# Patient Record
Sex: Female | Born: 1995 | Hispanic: No | Marital: Single | Smoking: Never smoker
Health system: Southern US, Community
[De-identification: ages and names within clinical notes are randomized; demographics above are authoritative.]

## PROBLEM LIST (undated history)

## (undated) DIAGNOSIS — J45909 Unspecified asthma, uncomplicated: Secondary | ICD-10-CM

## (undated) HISTORY — PX: TONSILLECTOMY: SUR1361

## (undated) HISTORY — PX: HAND LIGAMENT RECONSTRUCTION: SHX1726

## (undated) HISTORY — PX: BUNIONECTOMY: SHX129

---
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
## 2017-10-19 ENCOUNTER — Encounter: Payer: Self-pay | Admitting: Allergy and Immunology

## 2017-10-19 ENCOUNTER — Ambulatory Visit (INDEPENDENT_AMBULATORY_CARE_PROVIDER_SITE_OTHER): Payer: Medicaid Other | Admitting: Allergy and Immunology

## 2017-10-19 VITALS — BP 116/74 | HR 76 | Temp 98.2°F | Resp 20 | Ht 64.0 in | Wt 243.6 lb

## 2017-10-19 DIAGNOSIS — J3089 Other allergic rhinitis: Secondary | ICD-10-CM

## 2017-10-19 DIAGNOSIS — T7800XA Anaphylactic reaction due to unspecified food, initial encounter: Secondary | ICD-10-CM | POA: Insufficient documentation

## 2017-10-19 DIAGNOSIS — T7800XD Anaphylactic reaction due to unspecified food, subsequent encounter: Secondary | ICD-10-CM | POA: Diagnosis not present

## 2017-10-19 DIAGNOSIS — J4520 Mild intermittent asthma, uncomplicated: Secondary | ICD-10-CM | POA: Insufficient documentation

## 2017-10-19 MED ORDER — CARBINOXAMINE MALEATE ER 4 MG/5ML PO SUER: 4.0000 mg | ORAL | 5 refills | Status: AC | PRN

## 2017-10-19 MED ORDER — EPINEPHRINE 0.3 MG/0.3ML IJ SOAJ: INTRAMUSCULAR | 1 refills | Status: AC

## 2017-10-19 MED ORDER — ALBUTEROL SULFATE HFA 108 (90 BASE) MCG/ACT IN AERS: 2.0000 | INHALATION_SPRAY | RESPIRATORY_TRACT | 1 refills | Status: AC | PRN

## 2017-10-19 MED ORDER — AZELASTINE HCL 0.1 % NA SOLN: NASAL | 5 refills | Status: AC

## 2017-10-19 MED ORDER — FLUTICASONE PROPIONATE 50 MCG/ACT NA SUSP: NASAL | 5 refills | Status: DC

## 2017-10-19 NOTE — Assessment & Plan Note (Signed)
   Continue to have access to albuterol HFA, 1-2 inhalations every 6 hours if needed.  Subjective and objective measures of pulmonary function will be followed and the treatment plan will be adjusted accordingly.

## 2017-10-19 NOTE — Assessment & Plan Note (Addendum)
   Aeroallergen avoidance measures have been discussed and provided in written form.  A prescription has been provided for [facility] (carbinoxamine) 6-16 mg twice daily as needed.  A prescription has been provided for azelastine nasal spray, 1-2 sprays per nostril 2 times daily as needed. Proper nasal spray technique has been discussed and demonstrated.   If needed, may continue fluticasone nasal spray as well.  Nasal saline spray (i.e., Simply Saline) or nasal saline lavage (i.e., NeilMed) is recommended as needed and prior to medicated nasal sprays.  If allergen avoidance measures and medications fail to adequately relieve symptoms, aeroallergen immunotherapy will be considered.

## 2017-10-19 NOTE — Patient Instructions (Addendum)
History of food allergy Possible food allergy.  It is unclear if this is an IgE mediated process or a particularly strong sensitivity to the acidity of tomato-based foods.  The patients history suggests tomato allergy, though todays skin tests were negative despite a positive histamine control.  Food allergen skin testing has excellent negative predictive value however there is still a 5% chance that the allergy exists.  Therefore, we will investigate further with serum specific IgE levels and, if negative, open graded oral challenge.  A laboratory order form has been provided for serum specific IgE against tomato.  Until the food allergy has been definitively ruled out, the patient is to continue meticulous avoidance and have access to epinephrine autoinjector 2 pack.  Perennial and seasonal allergic rhinitis  Aeroallergen avoidance measures have been discussed and provided in written form.  A prescription has been provided for [facility] ER (carbinoxamine) 6-16 mg twice daily as needed.  A prescription has been provided for azelastine nasal spray, 1-2 sprays per nostril 2 times daily as needed. Proper nasal spray technique has been discussed and demonstrated.   If needed, may continue fluticasone nasal spray as well.  Nasal saline spray (i.e., Simply Saline) or nasal saline lavage (i.e., NeilMed) is recommended as needed and prior to medicated nasal sprays.  If allergen avoidance measures and medications fail to adequately relieve symptoms, aeroallergen immunotherapy will be considered.  Mild intermittent asthma  Continue to have access to albuterol HFA, 1-2 inhalations every 6 hours if needed.  Subjective and objective measures of pulmonary function will be followed and the treatment plan will be adjusted accordingly.   When lab results have returned the patient will be called with further recommendations and follow up instructions.   Reducing Pollen Exposure  The American Academy  of Allergy, Asthma and Immunology suggests the following steps to reduce your exposure to pollen during allergy seasons.    1. Do not hang sheets or clothing out to dry; pollen may collect on these items. 2. Do not mow lawns or spend time around freshly cut grass; mowing stirs up pollen. 3. Keep windows closed at night.  Keep car windows closed while driving. 4. Minimize morning activities outdoors, a time when pollen counts are usually at their highest. 5. Stay indoors as much as possible when pollen counts or humidity is high and on windy days when pollen tends to remain in the air longer. 6. Use air conditioning when possible.  Many air conditioners have filters that trap the pollen spores. 7. Use a HEPA room air filter to remove pollen form the indoor air you breathe.   Control of House Dust Mite Allergen  House dust mites play a major role in allergic asthma and rhinitis.  They occur in environments with high humidity wherever human skin, the food for dust mites is found. High levels have been detected in dust obtained from mattresses, pillows, carpets, upholstered furniture, bed covers, clothes and soft toys.  The principal allergen of the house dust mite is found in its feces.  A gram of dust may contain 1,000 mites and 250,000 fecal particles.  Mite antigen is easily measured in the air during house cleaning activities.    1. Encase mattresses, including the box spring, and pillow, in an air tight cover.  Seal the zipper end of the encased mattresses with wide adhesive tape. 2. Wash the bedding in water of 130 degrees Farenheit weekly.  Avoid cotton comforters/quilts and flannel bedding: the most ideal bed covering is the  dacron comforter. 3. Remove all upholstered furniture from the bedroom. 4. Remove carpets, carpet padding, rugs, and non-washable window drapes from the bedroom.  Wash drapes weekly or use plastic window coverings. 5. Remove all non-washable stuffed toys from the  bedroom.  Wash stuffed toys weekly. 6. Have the room cleaned frequently with a vacuum cleaner and a damp dust-mop.  The patient should not be in a room which is being cleaned and should wait 1 hour after cleaning before going into the room. 7. Close and seal all heating outlets in the bedroom.  Otherwise, the room will become filled with dust-laden air.  An electric heater can be used to heat the room. Reduce indoor humidity to less than 50%.  Do not use a humidifier.  Control of Dog or Cat Allergen  Avoidance is the best way to manage a dog or cat allergy. If you have a dog or cat and are allergic to dog or cats, consider removing the dog or cat from the home. If you have a dog or cat but don't want to find it a new home, or if your family wants a pet even though someone in the household is allergic, here are some strategies that may help keep symptoms at bay:  1. Keep the pet out of your bedroom and restrict it to only a few rooms. Be advised that keeping the dog or cat in only one room will not limit the allergens to that room. 2. Don't pet, hug or kiss the dog or cat; if you do, wash your hands with soap and water. 3. High-efficiency particulate air (HEPA) cleaners run continuously in a bedroom or living room can reduce allergen levels over time. 4. Place electrostatic material sheet in the air inlet vent in the bedroom. 5. Regular use of a high-efficiency vacuum cleaner or a central vacuum can reduce allergen levels. 6. Giving your dog or cat a bath at least once a week can reduce airborne allergen.  Control of Mold Allergen  Mold and fungi can grow on a variety of surfaces provided certain temperature and moisture conditions exist.  Outdoor molds grow on plants, decaying vegetation and soil.  The major outdoor mold, Alternaria and Cladosporium, are found in very high numbers during hot and dry conditions.  Generally, a late Summer - Fall peak is seen for common outdoor fungal spores.  Rain  will temporarily lower outdoor mold spore count, but counts rise rapidly when the rainy period ends.  The most important indoor molds are Aspergillus and Penicillium.  Dark, humid and poorly ventilated basements are ideal sites for mold growth.  The next most common sites of mold growth are the bathroom and the kitchen.  Outdoor [employer] 1. Use air conditioning and keep windows closed 2. Avoid exposure to decaying vegetation. 3. Avoid leaf raking. 4. Avoid grain handling. 5. Consider wearing a face mask if working in moldy areas.  Indoor Mold Control 1. Maintain humidity below 50%. 2. Clean washable surfaces with 5% bleach solution. 3. Remove sources e.g. Contaminated carpets.  Control of Cockroach Allergen  Cockroach allergen has been identified as an important cause of acute attacks of asthma, especially in urban settings.  There are fifty-five species of cockroach that exist in the [place], however only three, the [place], [place] species produce allergen that can affect patients with Asthma.  Allergens can be obtained from fecal particles, egg casings and secretions from cockroaches.    1. Remove food sources. 2. Reduce access  to water. 3. Seal access and entry points. 4. Spray runways with 0.5-1% Diazinon or Chlorpyrifos 5. Blow boric acid power under stoves and refrigerator. 6. Place bait stations (hydramethylnon) at feeding sites.

## 2017-10-19 NOTE — Progress Notes (Signed)
New Patient Note  RE: Melissa HoesKassandra Ramos MRN: [id number] DOB: 05-21-1996 Date of Office Visit: 10/19/2017  Referring provider: Otto HerbPincus, Maria D, MD Primary care provider: Otto HerbPincus, Maria D  Chief Complaint: Allergic Reaction and Allergic Rhinitis    History of present illness: Melissa Ramos is a 22 y.o. female seen today in consultation requested by Ammie DaltonMaria Pincus, MD.  Over the past 3 years she has noticed that consuming tomato-based products, such as marinara and ketchup, because acid reflux "instantly."  Approximately 2 weeks ago, she touched fresh cut tomato juice and instantly developed a rash on her hands at the point of contact which was described as burning, itchy, and red.   She did not experience concomitant cardiopulmonary or GI symptoms.  She did not require evaluation/treatment in the emergency department.  She took diphenhydramine for symptom relief, however it took approximately 1 week for the rash to resolve. She was diagnosed with asthma when she was approximately 22 years of age.  Currently, her asthma only seems to flare with upper respiratory tract infections.  The last time that she experienced asthma symptoms requiring albuterol rescue was in the fall 2018 during a sinus infection. She reports that she "always" experiences rhinorrhea and thick postnasal drainage.  These symptoms occur year around but are more frequent and severe during the springtime.  She had tonsillectomy last year, however did not perceive benefit from that procedure.   Assessment and plan: History of food allergy Possible food allergy.  It is unclear if this is an IgE mediated process or a particularly strong sensitivity to the acidity of tomato-based foods.  The patients history suggests tomato allergy, though todays skin tests were negative despite a positive histamine control.  Food allergen skin testing has excellent negative predictive value however there is still a 5% chance that the allergy  exists.  Therefore, we will investigate further with serum specific IgE levels and, if negative, open graded oral challenge.  A laboratory order form has been provided for serum specific IgE against tomato.  Until the food allergy has been definitively ruled out, the patient is to continue meticulous avoidance and have access to epinephrine autoinjector 2 pack.  Perennial and seasonal allergic rhinitis  Aeroallergen avoidance measures have been discussed and provided in written form.  [facility]) 6-16 mg twice daily as needed.  A prescription has been provided for azelastine nasal spray, 1-2 sprays per nostril 2 times daily as needed. Proper nasal spray technique has been discussed and demonstrated.   If needed, may continue fluticasone nasal spray as well.  Nasal saline spray (i.e., Simply Saline) or nasal saline lavage (i.e., NeilMed) is recommended as needed and prior to medicated nasal sprays.  If allergen avoidance measures and medications fail to adequately relieve symptoms, aeroallergen immunotherapy will be considered.  Mild intermittent asthma  Continue to have access to albuterol HFA, 1-2 inhalations every 6 hours if needed.  Subjective and objective measures of pulmonary function will be followed and the treatment plan will be adjusted accordingly.   Meds ordered this encounter  Medications  . EPINEPHrine 0.3 mg/0.3 mL IJ SOAJ injection    Sig: Use as directed for severe allergic reaction.    Dispense:  2 Device    Refill:  1    Dispense mylan generic brand only.  . Carbinoxamine Maleate ER [facility](KARBINAL ER) 4 MG/5ML SUER    Sig: Take 4 mg by mouth as needed (take 7.5 ml -20ml twice daily  if needed for runny nose).    Dispense:  1200 mL    Refill:  5  . azelastine (ASTELIN) 0.1 % nasal spray    Sig: 1-2 sprays per nostril 2 times daily as needed for runny nose    Dispense:  30 mL    Refill:  5  . albuterol (PROAIR  HFA) 108 (90 Base) MCG/ACT inhaler    Sig: Inhale 2 puffs into the lungs every 4 (four) hours as needed for wheezing or shortness of breath.    Dispense:  1 Inhaler    Refill:  1  . fluticasone (FLONASE) 50 MCG/ACT nasal spray    Sig: 2 sprays per nostril as needed for stuffy nose.    Dispense:  16 g    Refill:  5    Diagnostics: Spirometry:  Normal with an FEV1 of 100% predicted.  Please see scanned spirometry results for details. Environmental epicutaneous testing: Positive to grass pollen, molds, cat hair, dog epithelia, cockroach antigen, and dust mite antigen. Environmental intradermal testing: Positive to weed pollen and molds. Food allergen skin testing: Negative despite a positive histamine control.    Physical examination: Blood pressure 116/74, pulse 76, temperature 98.2 F (36.8 C), temperature source Oral, resp. rate 20, height 5\' 4"  (1.626 m), weight 243 lb 9.7 oz (110.5 kg), SpO2 99 %.  General: Alert, interactive, in no acute distress. HEENT: TMs pearly gray, turbinates moderately edematous without discharge, post-pharynx moderately erythematous. Neck: Supple without lymphadenopathy. Lungs: Clear to auscultation without wheezing, rhonchi or rales. CV: Normal S1, S2 without murmurs. Abdomen: Nondistended, nontender. Skin: Warm and dry, without lesions or rashes. Extremities:  No clubbing, cyanosis or edema. Neuro:   Grossly intact.  Review of systems:  Review of systems negative except as noted in HPI / PMHx or noted below: Review of Systems  Constitutional: Negative.   HENT: Negative.   Eyes: Negative.   Respiratory: Negative.   Cardiovascular: Negative.   Gastrointestinal: Negative.   Genitourinary: Negative.   Musculoskeletal: Negative.   Skin: Negative.   Neurological: Negative.   Endo/Heme/Allergies: Negative.   Psychiatric/Behavioral: Negative.     Past medical history:  Past Medical History:  Diagnosis Date  . Asthma     Past surgical  history:  Past Surgical History:  Procedure Laterality Date  . BUNIONECTOMY    . HAND LIGAMENT RECONSTRUCTION    . TONSILLECTOMY    . WISDOM TOOTH EXTRACTION  2017    Family history: Family History  Problem Relation Age of Onset  . Allergic rhinitis Mother   . Asthma Mother   . Food Allergy Mother   . Allergic rhinitis Brother   . Asthma Brother   . Angioedema Neg Hx   . Eczema Neg Hx   . Immunodeficiency Neg Hx   . Urticaria Neg Hx     Social history: Social History   Socioeconomic History  . Marital status: Single    Spouse name: Not on file  . Number of children: Not on file  . Years of education: Not on file  . Highest education level: Not on file  Social Needs  . Financial resource strain: Not on file  . Food insecurity - worry: Not on file  . Food insecurity - inability: Not on file  . Transportation needs - medical: Not on file  . Transportation needs - non-medical: Not on file  Occupational History  . Not on file  Tobacco Use  . Smoking status: Never Smoker  . Smokeless tobacco: Never Used  Substance and Sexual Activity  . Alcohol use: No  . Drug use: No  . Sexual activity: Not on file  Other Topics Concern  . Not on file  Social History Narrative  . Not on file   Environmental History: The patient lives in an apartment with carpeting throughout and central air/heat.  She is a non-smoker without pets.  There is no known mold/water damage in the home.  Allergies as of 10/19/2017   No Known Allergies     Medication List        Accurate as of 10/19/17  5:09 PM. Always use your most recent med list.          albuterol 108 (90 Base) MCG/ACT inhaler Commonly known as:  PROAIR HFA Inhale 2 puffs into the lungs every 4 (four) hours as needed for wheezing or shortness of breath.   azelastine 0.1 % nasal spray Commonly known as:  ASTELIN 1-2 sprays per nostril 2 times daily as needed for runny nose   Carbinoxamine Maleate ER 4 MG/5ML  Suer Commonly known as:  KARBINAL ER Take 4 mg by mouth as needed (take 7.5 ml -20ml twice daily if needed for runny nose).   EPINEPHrine 0.3 mg/0.3 mL Soaj injection Commonly known as:  EPI-PEN Use as directed for severe allergic reaction.   fluticasone 50 MCG/ACT nasal spray Commonly known as:  FLONASE Place 2 sprays into both nostrils daily.   fluticasone 50 MCG/ACT nasal spray Commonly known as:  FLONASE 2 sprays per nostril as needed for stuffy nose.   norgestimate-ethinyl estradiol 0.25-35 MG-MCG tablet Commonly known as:  ORTHO-CYCLEN,SPRINTEC,PREVIFEM Take 1 tablet by mouth daily.       Known medication allergies: No Known Allergies  I appreciate the opportunity to take part in Jakirah's care. Please do not hesitate to contact me with questions.  Sincerely,   R. Jorene Guest, MD

## 2017-10-19 NOTE — Assessment & Plan Note (Signed)
Possible food allergy.  It is unclear if this is an IgE mediated process or a particularly strong sensitivity to the acidity of tomato-based foods.  The patients history suggests tomato allergy, though todays skin tests were negative despite a positive histamine control.  Food allergen skin testing has excellent negative predictive value however there is still a 5% chance that the allergy exists.  Therefore, we will investigate further with serum specific IgE levels and, if negative, open graded oral challenge.  A laboratory order form has been provided for serum specific IgE against tomato.  Until the food allergy has been definitively ruled out, the patient is to continue meticulous avoidance and have access to epinephrine autoinjector 2 pack.

## 2017-10-22 LAB — ALLERGEN, TOMATO F25: Allergen Tomato, IgE: <0.1 kU/L

## 2018-05-20 ENCOUNTER — Emergency Department (HOSPITAL_COMMUNITY): Payer: BLUE CROSS/BLUE SHIELD

## 2018-05-20 ENCOUNTER — Encounter (HOSPITAL_COMMUNITY): Payer: Self-pay | Admitting: Emergency Medicine

## 2018-05-20 ENCOUNTER — Other Ambulatory Visit: Payer: Self-pay

## 2018-05-20 ENCOUNTER — Emergency Department (HOSPITAL_COMMUNITY)
Admission: EM | Admit: 2018-05-20 | Discharge: 2018-05-21 | Disposition: A | Discharge status: Home / Self Care | Payer: BLUE CROSS/BLUE SHIELD | Attending: Emergency Medicine | Admitting: Emergency Medicine

## 2018-05-20 DIAGNOSIS — F410 Panic disorder [episodic paroxysmal anxiety] without agoraphobia: Secondary | ICD-10-CM | POA: Insufficient documentation

## 2018-05-20 DIAGNOSIS — Z79899 Other long term (current) drug therapy: Secondary | ICD-10-CM | POA: Diagnosis not present

## 2018-05-20 DIAGNOSIS — J4520 Mild intermittent asthma, uncomplicated: Secondary | ICD-10-CM | POA: Diagnosis not present

## 2018-05-20 DIAGNOSIS — R000 Tachycardia, unspecified: Secondary | ICD-10-CM | POA: Diagnosis not present

## 2018-05-20 DIAGNOSIS — R079 Chest pain, unspecified: Secondary | ICD-10-CM | POA: Diagnosis present

## 2018-05-20 LAB — BASIC METABOLIC PANEL
Anion gap: 11 (ref 5–15)
BUN: 11 mg/dL (ref 6–20)
CALCIUM: 9.3 mg/dL (ref 8.9–10.3)
CO2: 21 mmol/L — ABNORMAL LOW (ref 22–32)
Chloride: 107 mmol/L (ref 98–111)
Creatinine, Ser: 0.77 mg/dL (ref 0.44–1.00)
Glucose, Bld: 116 mg/dL — ABNORMAL HIGH (ref 70–99)
Potassium: 3.5 mmol/L (ref 3.5–5.1)
SODIUM: 139 mmol/L (ref 135–145)

## 2018-05-20 LAB — CBC
HCT: 41.2 % (ref 36.0–46.0)
Hemoglobin: 13.4 g/dL (ref 12.0–15.0)
MCH: 27.6 pg (ref 26.0–34.0)
MCHC: 32.5 g/dL (ref 30.0–36.0)
MCV: 84.9 fL (ref 78.0–100.0)
PLATELETS: 343 10*3/uL (ref 150–400)
RBC: 4.85 MIL/uL (ref 3.87–5.11)
RDW: 12.8 % (ref 11.5–15.5)
WBC: 10 10*3/uL (ref 4.0–10.5)

## 2018-05-20 LAB — I-STAT BETA HCG BLOOD, ED (MC, WL, AP ONLY): I-stat hCG, quantitative: <5 m[IU]/mL (ref <5)

## 2018-05-20 LAB — I-STAT TROPONIN, ED: TROPONIN I, POC: 0 ng/mL (ref 0.00–0.08)

## 2018-05-20 NOTE — ED Triage Notes (Signed)
Pt was watching a movie and started to feel anxious.  Then felt sob, nausea, and pain in center of chest that radiates to throat.  Pt tearful on arrival.

## 2018-05-21 MED ORDER — LORAZEPAM 1 MG PO TABS
0.5000 mg | ORAL_TABLET | Freq: Once | ORAL | Status: AC
  Administered 2018-05-21: 0.5 mg via ORAL
  Filled 2018-05-21: qty 1

## 2018-05-21 NOTE — ED Provider Notes (Signed)
[facility] Note   CSN: [id number] Arrival date & time: 05/20/18  1957     History   Chief Complaint Chief Complaint  Patient presents with  . Chest Pain  . Shortness of Breath    HPI Melissa Ramos is a [age] y.o. female.  HPI Patient presents to the emergency department with increased anxiety with tingling of the extremities and tingling of the face with heavy breathing.  The patient states she was at a movie when the symptoms started.  She states that she felt a pressure in the center of her chest.  The patient states she was at a horror movie but did not necessarily feel scared.  Patient states she has not had this type of sensation in the past.  She states that she was breathing fast when this started.  The patient denies  headache,blurred vision, neck pain, fever, cough, weakness,  dizziness, anorexia, edema, abdominal pain, nausea, vomiting, diarrhea, rash, back pain, dysuria, hematemesis, bloody stool, near syncope, or syncope. Past Medical History:  Diagnosis Date  . Asthma     Patient Active Problem List   Diagnosis Date Noted  . History of food allergy 10/19/2017  . Perennial and seasonal allergic rhinitis 10/19/2017  . Mild intermittent asthma 10/19/2017    Past Surgical History:  Procedure Laterality Date  . BUNIONECTOMY    . HAND LIGAMENT RECONSTRUCTION    . TONSILLECTOMY    . WISDOM TOOTH EXTRACTION  2017     OB History   None      Home Medications    Prior to Admission medications   Medication Sig Start Date End Date Taking? Authorizing Provider  albuterol (PROAIR HFA) 108 (90 Base) MCG/ACT inhaler Inhale 2 puffs into the lungs every 4 (four) hours as needed for wheezing or shortness of breath. 10/19/17  Yes Bobbitt, Heywood Ilesalph Carter, MD  azelastine (ASTELIN) 0.1 % nasal spray 1-2 sprays per nostril 2 times daily as needed for runny nose Patient taking differently: Place 1 spray into both nostrils 2  (two) times daily as needed.  10/19/17  Yes Bobbitt, Heywood Ilesalph Carter, MD  Carbinoxamine Maleate ER [facility](KARBINAL ER) 4 MG/5ML SUER Take 4 mg by mouth as needed (take 7.5 ml -20ml twice daily if needed for runny nose). Patient taking differently: Take 6-16 mg by mouth 2 (two) times daily as needed (for runny nose).  10/19/17  Yes Bobbitt, Heywood Ilesalph Carter, MD  EPINEPHrine 0.3 mg/0.3 mL IJ SOAJ injection Use as directed for severe allergic reaction. Patient taking differently: Inject 0.3 mg into the muscle as needed (for severe allergic reaction.).  10/19/17  Yes Bobbitt, Heywood Ilesalph Carter, MD  fluticasone [facility](FLONASE) 50 MCG/ACT nasal spray 2 sprays per nostril as needed for stuffy nose. Patient taking differently: Place 2 sprays into both nostrils daily as needed for allergies.  10/19/17  Yes Bobbitt, Heywood Ilesalph Carter, MD    Family History Family History  Problem Relation Age of Onset  . Allergic rhinitis Mother   . Asthma Mother   . Food Allergy Mother   . Allergic rhinitis Brother   . Asthma Brother   . Angioedema Neg Hx   . Eczema Neg Hx   . Immunodeficiency Neg Hx   . Urticaria Neg Hx     Social History Social History   Tobacco Use  . Smoking status: Never Smoker  . Smokeless tobacco: Never Used  Substance Use Topics  . Alcohol use: No  . Drug use: No  Allergies   Silver   Review of Systems Review of Systems All other systems negative except as documented in the HPI. All pertinent positives and negatives as reviewed in the HPI.  Physical Exam Updated Vital Signs BP 130/75   Pulse 87   Temp 98.6 F (37 C) (Oral)   Resp 12   Ht 5\' 4"  (1.626 m)   Wt 111.1 kg   LMP 05/13/2018 Comment: pt shielded  SpO2 100%   BMI 42.05 kg/m   Physical Exam  Constitutional: She is oriented to person, place, and time. She appears well-developed and well-nourished. No distress.  HENT:  Head: Normocephalic and atraumatic.  Mouth/Throat: Oropharynx is clear and moist.  Eyes: Pupils are equal, round,  and reactive to light.  Neck: Normal range of motion. Neck supple.  Cardiovascular: Regular rhythm and normal heart sounds. Tachycardia present. Exam reveals no gallop and no friction rub.  No murmur heard. Pulmonary/Chest: Effort normal and breath sounds normal. No respiratory distress. She has no decreased breath sounds. She has no wheezes. She has no rhonchi. She has no rales.  Abdominal: Soft. Bowel sounds are normal. She exhibits no distension. There is no tenderness.  Neurological: She is alert and oriented to person, place, and time. She exhibits normal muscle tone. Coordination normal.  Skin: Skin is warm and dry. Capillary refill takes less than 2 seconds. No rash noted. No erythema.  Psychiatric: She has a normal mood and affect. Her behavior is normal.  Nursing note and vitals reviewed.    ED Treatments / Results  Labs (all labs ordered are listed, but only abnormal results are displayed) Labs Reviewed  BASIC METABOLIC PANEL - Abnormal; Notable for the following components:      Result Value   CO2 21 (*)    Glucose, Bld 116 (*)    All other components within normal limits  CBC  I-STAT TROPONIN, ED  I-STAT BETA HCG BLOOD, ED ([facility], [facility], [facility] ONLY)    EKG None  Radiology Dg Chest 2 View  Result Date: 05/20/2018 CLINICAL DATA:  Shortness of breath, chest pain EXAM: CHEST - 2 VIEW COMPARISON:  None. FINDINGS: Low lung volumes. No confluent opacities or effusions. Heart is normal size. No acute bony abnormality. IMPRESSION: Low volumes.  No active disease. Electronically Signed   By: Charlett Nose M.D.   On: 05/20/2018 20:26    Procedures Procedures (including critical care time)  Medications Ordered in ED Medications  LORazepam (ATIVAN) tablet 0.5 mg (0.5 mg Oral Given 05/21/18 0336)     Initial Impression / Assessment and Plan / ED Course  I have reviewed the triage vital signs and the nursing notes.  Pertinent labs & imaging results that were available during my care  of the patient were reviewed by me and considered in my medical decision making (see chart for details).     Patient is feeling better following treatment here in the emergency department.  I feel the patient had an anxiety type reaction that caused her symptoms.  The patient has had normal vitals over the last hour of her visit.  Patient will be asked to return here as needed.  Patient symptoms seem atypical for cardiac chest pain or pulmonary embolus. Final Clinical Impressions(s) / ED Diagnoses   Final diagnoses:  None    ED Discharge Orders    None       Charlestine Night, PA-C 05/21/18 0427    Palumbo, April, MD 05/21/18 [phone]

## 2018-05-21 NOTE — Discharge Instructions (Addendum)
Your testing here today was normal.  Return here as needed.  Follow-up with your primary doctor.

## 2018-09-25 ENCOUNTER — Other Ambulatory Visit: Payer: Self-pay | Admitting: Allergy

## 2018-09-25 DIAGNOSIS — J3089 Other allergic rhinitis: Secondary | ICD-10-CM

## 2018-09-25 MED ORDER — FLUTICASONE PROPIONATE 50 MCG/ACT NA SUSP: 2.0000 | Freq: Every day | NASAL | 0 refills | Status: AC | PRN

## 2018-10-10 ENCOUNTER — Ambulatory Visit: Payer: BLUE CROSS/BLUE SHIELD | Admitting: Family Medicine

## 2018-11-13 ENCOUNTER — Other Ambulatory Visit: Payer: Self-pay

## 2018-11-13 DIAGNOSIS — J4520 Mild intermittent asthma, uncomplicated: Secondary | ICD-10-CM

## 2023-12-24 ENCOUNTER — Ambulatory Visit
Admission: EM | Admit: 2023-12-24 | Discharge: 2023-12-24 | Disposition: A | Discharge status: Home / Self Care | Attending: Family Medicine | Admitting: Family Medicine

## 2023-12-24 DIAGNOSIS — J40 Bronchitis, not specified as acute or chronic: Secondary | ICD-10-CM | POA: Diagnosis not present

## 2023-12-24 DIAGNOSIS — J309 Allergic rhinitis, unspecified: Secondary | ICD-10-CM | POA: Diagnosis not present

## 2023-12-24 DIAGNOSIS — J4530 Mild persistent asthma, uncomplicated: Secondary | ICD-10-CM

## 2023-12-24 DIAGNOSIS — J329 Chronic sinusitis, unspecified: Secondary | ICD-10-CM | POA: Diagnosis not present

## 2023-12-24 MED ORDER — PREDNISONE 20 MG PO TABS: ORAL_TABLET | ORAL | 0 refills | Status: AC

## 2023-12-24 MED ORDER — AMOXICILLIN-POT CLAVULANATE 875-125 MG PO TABS: 1.0000 | ORAL_TABLET | Freq: Two times a day (BID) | ORAL | 0 refills | Status: DC

## 2023-12-24 MED ORDER — PROMETHAZINE-DM 6.25-15 MG/5ML PO SYRP: 5.0000 mL | ORAL_SOLUTION | Freq: Three times a day (TID) | ORAL | 0 refills | Status: AC | PRN

## 2023-12-24 NOTE — Discharge Instructions (Signed)
 We will manage this as a sinobronchitis infection with Augmentin and prednisone. For sore throat or cough try using a honey-based tea. Use 3 teaspoons of honey with juice squeezed from half lemon. Place shaved pieces of ginger into 1/2-1 cup of water and warm over stove top. Then mix the ingredients and repeat every 4 hours as needed. Please take Tylenol 500mg -650mg  every 6 hours for throat pain, fevers, aches and pains. Hydrate very well with at least 2 liters of water. Eat light meals such as soups (chicken and noodles, vegetable, chicken and wild rice).  Do not eat foods that you are allergic to.  Taking an antihistamine like Zyrtec can help against postnasal drainage, sinus congestion which can cause sinus pain, sinus headaches, throat pain, painful swallowing, coughing.  You can take this together with prednisone and albuterol . Wait 4 hours to breastfeed after taking prednisone.  Use cough medication at bedtime after breastfeeding as needed.

## 2023-12-24 NOTE — ED Provider Notes (Signed)
 [facility] - URGENT CARE C[facility]  Note:  This document was prepared using Conservation officer, historic buildings and may include unintentional dictation errors.  MRN: [id number] DOB: [birth year]  Subjective:   Melissa Ramos is a 28 y.o. female presenting for 1 week history of acute onset persistent and worsening sinus congestion, sinus pressure, now having severe left-sided facial pain, left-sided headaches.  Has also been having a productive cough and wheezing.  Has a history of asthma, has been using her albuterol  inhaler consistently.  She also uses Flonase  and Zyrtec twice daily.  Patient is currently breast-feeding.  No current facility-administered medications for this encounter.  Current Outpatient Medications:    escitalopram (LEXAPRO) 20 MG tablet, Take 1 tablet by mouth daily., Disp: , Rfl:    propranolol ER (INDERAL LA) 60 MG 24 hr capsule, Take 60 mg by mouth daily., Disp: , Rfl:    albuterol  (PROAIR  HFA) 108 (90 Base) MCG/ACT inhaler, Inhale 2 puffs into the lungs every 4 (four) hours as needed for wheezing or shortness of breath., Disp: 1 Inhaler, Rfl: 1   azelastine  (ASTELIN ) 0.1 % nasal spray, 1-2 sprays per nostril 2 times daily as needed for runny nose (Patient taking differently: Place 1 spray into both nostrils 2 (two) times daily as needed.), Disp: 30 mL, Rfl: 5   Carbinoxamine  Maleate ER (KARBINAL  ER) 4 MG/5ML SUER, Take 4 mg by mouth as needed (take 7.5 ml -20ml twice daily if needed for runny nose). (Patient taking differently: Take 6-16 mg by mouth 2 (two) times daily as needed (for runny nose).), Disp: 1200 mL, Rfl: 5   EPINEPHrine  0.3 mg/0.3 mL IJ SOAJ injection, Use as directed for severe allergic reaction. (Patient taking differently: Inject 0.3 mg into the muscle as needed (for severe allergic reaction.).), Disp: 2 Device, Rfl: 1   fluticasone  (FLONASE ) 50 MCG/ACT nasal spray, Place 2 sprays into both nostrils daily as needed for allergies., Disp: 16 g, Rfl: 0    Allergies  Allergen Reactions   Silver Itching    Bad Blisters   Tape Dermatitis    Other Reaction(s): Dermatitis, Dermatitis    Oozing Blisters and Welts  Oozing Blisters and Welts    Past Medical History:  Diagnosis Date   Asthma      Past Surgical History:  Procedure Laterality Date   BUNIONECTOMY     HAND LIGAMENT RECONSTRUCTION     TONSILLECTOMY     WISDOM TOOTH EXTRACTION  2017    Family History  Problem Relation Age of Onset   Allergic rhinitis Mother    Asthma Mother    Food Allergy Mother    Allergic rhinitis Brother    Asthma Brother    Angioedema Neg Hx    Eczema Neg Hx    Immunodeficiency Neg Hx    Urticaria Neg Hx     Social History   Tobacco Use   Smoking status: Never   Smokeless tobacco: Never  Vaping Use   Vaping status: Never Used  Substance Use Topics   Alcohol use: No   Drug use: No    ROS   Objective:   Vitals: BP 121/80 (BP Location: Left Arm)   Pulse 86   Temp 98.7 F (37.1 C) (Oral)   Resp 18   SpO2 96%   Physical Exam Constitutional:      General: She is not in acute distress.    Appearance: Normal appearance. She is well-developed and normal weight. She is not ill-appearing, toxic-appearing or diaphoretic.  HENT:  Head: Normocephalic and atraumatic.     Right Ear: Tympanic membrane, ear canal and external ear normal. No drainage or tenderness. No middle ear effusion. There is no impacted cerumen. Tympanic membrane is not erythematous or bulging.     Left Ear: Tympanic membrane, ear canal and external ear normal. No drainage or tenderness.  No middle ear effusion. There is no impacted cerumen. Tympanic membrane is not erythematous or bulging.     Nose: Congestion present. No rhinorrhea.     Comments: Left side maxillary sinus tenderness.    Mouth/Throat:     Mouth: Mucous membranes are moist. No oral lesions.     Pharynx: No pharyngeal swelling, oropharyngeal exudate, posterior oropharyngeal erythema or uvula  swelling.     Tonsils: No tonsillar exudate or tonsillar abscesses.  Eyes:     General: No scleral icterus.       Right eye: No discharge.        Left eye: No discharge.     Extraocular Movements: Extraocular movements intact.     Right eye: Normal extraocular motion.     Left eye: Normal extraocular motion.     Conjunctiva/sclera: Conjunctivae normal.  Cardiovascular:     Rate and Rhythm: Normal rate and regular rhythm.     Heart sounds: Normal heart sounds. No murmur heard.    No friction rub. No gallop.  Pulmonary:     Effort: Pulmonary effort is normal. No respiratory distress.     Breath sounds: No stridor. Rhonchi present. No wheezing or rales.  Chest:     Chest wall: No tenderness.  Musculoskeletal:     Cervical back: Normal range of motion and neck supple.  Lymphadenopathy:     Cervical: No cervical adenopathy.  Skin:    General: Skin is warm and dry.  Neurological:     General: No focal deficit present.     Mental Status: She is alert and oriented to person, place, and time.  Psychiatric:        Mood and Affect: Mood normal.        Behavior: Behavior normal.     Assessment and Plan :   PDMP not reviewed this encounter.  1. Sinobronchitis   2. Allergic rhinitis, unspecified seasonality, unspecified trigger   3. Mild persistent asthma, uncomplicated    Patient did at home COVID and flu test both of which were negative.  Will defer repeat testing.  Will defer imaging as well.  Patient has an exacerbation of her asthma, recommended prednisone for this and sinobronchitis.  Use Augmentin to address bacterial infection.  Maintain all other medications.  Counseled patient on potential for adverse effects with medications prescribed/recommended today, ER and return-to-clinic precautions discussed, patient verbalized understanding.    Adolph Hoop, [place] 12/24/23 9147

## 2023-12-24 NOTE — ED Triage Notes (Signed)
 Cough with wheezing started 2 days ago. Patient reports facial and sinus pressure.  Home Intervention: Zyrtec

## 2024-01-28 ENCOUNTER — Encounter: Payer: Self-pay | Admitting: Emergency Medicine

## 2024-01-28 ENCOUNTER — Ambulatory Visit
Admission: EM | Admit: 2024-01-28 | Discharge: 2024-01-28 | Disposition: A | Discharge status: Home / Self Care | Attending: Family Medicine | Admitting: Family Medicine

## 2024-01-28 DIAGNOSIS — H60501 Unspecified acute noninfective otitis externa, right ear: Secondary | ICD-10-CM

## 2024-01-28 MED ORDER — AMOXICILLIN-POT CLAVULANATE 875-125 MG PO TABS: 1.0000 | ORAL_TABLET | Freq: Two times a day (BID) | ORAL | 0 refills | Status: AC

## 2024-01-28 MED ORDER — ACETAMINOPHEN 325 MG PO TABS
975.0000 mg | ORAL_TABLET | Freq: Once | ORAL | Status: AC
  Administered 2024-01-28: 975 mg via ORAL

## 2024-01-28 MED ORDER — OFLOXACIN 0.3 % OT SOLN: 10.0000 [drp] | Freq: Every day | OTIC | 0 refills | Status: DC

## 2024-01-28 MED ORDER — TRAMADOL HCL 50 MG PO TABS: 50.0000 mg | ORAL_TABLET | Freq: Four times a day (QID) | ORAL | 0 refills | Status: AC | PRN

## 2024-01-28 NOTE — Discharge Instructions (Addendum)
 Start antibiotic eardrops and oral antibiotic as prescribed.  You were given Tylenol in the clinic and you may continue over-the-counter Tylenol and/or ibuprofen  as needed for pain/fever.  You may take tramadol every 6 hours as needed for your pain.  Please follow-up with your PCP in 2 days for recheck.  Please go to the ER if you develop any worsening symptoms including but not limited to persistent uncontrolled pain or fever after 24 hours of treatment, facial swelling or neck swelling, or any new concerns that arise.  Hope you feel better soon!

## 2024-01-28 NOTE — ED Triage Notes (Signed)
 Patient c/o right sided facial and ear pain x 2 days.  Patient thought it was a pimple inside her ear, now the pain has gotten worse, radiating down face and neck.  Patient has taken Tylenol, Ibuprofen  and Otofloxicin ear drops.

## 2024-01-28 NOTE — ED Provider Notes (Addendum)
 [facility]    CSN: [id number] Arrival date & time: 01/28/24  1009      History   Chief Complaint Chief Complaint  Patient presents with   Otalgia    HPI Melissa Ramos is a 28 y.o. female presents for evaluation of ear pain.  Patient reports 2 days of right ear pain that is worsened.  Patient states she thought she had a pimple in her ear and now the pain is gotten worse.  It is radiating down into her neck.  Denies any drainage from the ear but does states she has had drainage from her right ear for 2 months for which she saw her PCP and was placed on oral antibiotics with resolution.  She denies any cough/congestion or allergy symptoms.  No swimming.  Denies pregnancy or breast-feeding.  She has been taking Tylenol, ibuprofen  and use some of her son's ofloxacin eardrops.   Otalgia   Past Medical History:  Diagnosis Date   Asthma     Patient Active Problem List   Diagnosis Date Noted   History of food allergy 10/19/2017   Perennial and seasonal allergic rhinitis 10/19/2017   Mild intermittent asthma 10/19/2017    Past Surgical History:  Procedure Laterality Date   BUNIONECTOMY     HAND LIGAMENT RECONSTRUCTION     TONSILLECTOMY     WISDOM TOOTH EXTRACTION  2017    OB History   No obstetric history on file.      Home Medications    Prior to Admission medications   Medication Sig Start Date End Date Taking? Authorizing Provider  albuterol  (PROAIR  HFA) 108 (90 Base) MCG/ACT inhaler Inhale 2 puffs into the lungs every 4 (four) hours as needed for wheezing or shortness of breath. 10/19/17  Yes Bobbitt, Colen Daunt, MD  amoxicillin -clavulanate (AUGMENTIN ) 875-125 MG tablet Take 1 tablet by mouth every 12 (twelve) hours for 10 days. 01/28/24 02/07/24 Yes Marvelous Woolford, Jodi R, NP  escitalopram (LEXAPRO) 20 MG tablet Take 1 tablet by mouth daily. 12/11/23 12/05/24 Yes [provider]  norethindrone (MICRONOR) 0.35 MG tablet Take 1 tablet by mouth daily.   Yes  [provider]  ofloxacin (FLOXIN) 0.3 % OTIC solution Place 10 drops into the right ear daily for 7 days. 01/28/24 02/04/24 Yes Chanele Douglas, Jodi R, NP  propranolol ER (INDERAL LA) 60 MG 24 hr capsule Take 60 mg by mouth daily.   Yes [provider]  traMADol (ULTRAM) 50 MG tablet Take 1 tablet (50 mg total) by mouth every 6 (six) hours as needed for severe pain (pain score 7-10). 01/28/24  Yes Imogen Maddalena, Jodi R, NP  azelastine  (ASTELIN ) 0.1 % nasal spray 1-2 sprays per nostril 2 times daily as needed for runny nose Patient taking differently: Place 1 spray into both nostrils 2 (two) times daily as needed. 10/19/17   Bobbitt, Colen Daunt, MD  Carbinoxamine  Maleate ER (KARBINAL  ER) 4 MG/5ML SUER Take 4 mg by mouth as needed (take 7.5 ml -20ml twice daily if needed for runny nose). Patient taking differently: Take 6-16 mg by mouth 2 (two) times daily as needed (for runny nose). 10/19/17   Bobbitt, Colen Daunt, MD  EPINEPHrine  0.3 mg/0.3 mL IJ SOAJ injection Use as directed for severe allergic reaction. Patient taking differently: Inject 0.3 mg into the muscle as needed (for severe allergic reaction.). 10/19/17   Bobbitt, Colen Daunt, MD  fluticasone  (FLONASE ) 50 MCG/ACT nasal spray Place 2 sprays into both nostrils daily as needed for allergies. 09/25/18  Jule Nyhan, MD  predniSONE  (DELTASONE ) 20 MG tablet Take 2 tablets daily with breakfast. 12/24/23   Adolph Hoop, PA-C  promethazine -dextromethorphan (PROMETHAZINE -DM) 6.25-15 MG/5ML syrup Take 5 mLs by mouth 3 (three) times daily as needed for cough. 12/24/23   Adolph Hoop, PA-C    Family History Family History  Problem Relation Age of Onset   Allergic rhinitis Mother    Asthma Mother    Food Allergy Mother    Allergic rhinitis Brother    Asthma Brother    Angioedema Neg Hx    Eczema Neg Hx    Immunodeficiency Neg Hx    Urticaria Neg Hx     Social History Social History   Tobacco Use   Smoking status: Never   Smokeless  tobacco: Never  Vaping Use   Vaping status: Every Day  Substance Use Topics   Alcohol use: No   Drug use: No     Allergies   Silver and Tape   Review of Systems Review of Systems  HENT:  Positive for ear pain.      Physical Exam Triage Vital Signs ED Triage Vitals  Encounter Vitals Group     BP 01/28/24 1030 113/77     Systolic BP Percentile --      Diastolic BP Percentile --      Pulse Rate 01/28/24 1030 96     Resp 01/28/24 1030 18     Temp 01/28/24 1030 (!) 101.2 F (38.4 C)     Temp Source 01/28/24 1030 Oral     SpO2 01/28/24 1030 98 %     Weight 01/28/24 1032 265 lb (120.2 kg)     Height 01/28/24 1032 5\' 4"  (1.626 m)     Head Circumference --      Peak Flow --      Pain Score 01/28/24 1032 10     Pain Loc --      Pain Education --      Exclude from Growth Chart --    No data found.  Updated Vital Signs BP 113/77 (BP Location: Right Arm)   Pulse 96   Temp (!) 101.2 F (38.4 C) (Oral)   Resp 18   Ht 5\' 4"  (1.626 m)   Wt 265 lb (120.2 kg)   LMP 01/25/2024 (Exact Date)   SpO2 98%   BMI 45.49 kg/m   Visual Acuity Right Eye Distance:   Left Eye Distance:   Bilateral Distance:    Right Eye Near:   Left Eye Near:    Bilateral Near:     Physical Exam Vitals and nursing note reviewed.  Constitutional:      General: She is not in acute distress.    Appearance: Normal appearance. She is not ill-appearing.  HENT:     Head: Normocephalic and atraumatic.     Right Ear: Swelling present. No mastoid tenderness.     Left Ear: Tympanic membrane and ear canal normal.     Ears:     Comments: There is moderate swelling of the right canal.  Only partial TM visible and is intact without erythema.  There is no mastoid tenderness with palpation or postauricular edema or erythema Eyes:     Pupils: Pupils are equal, round, and reactive to light.  Cardiovascular:     Rate and Rhythm: Normal rate.  Pulmonary:     Effort: Pulmonary effort is normal.  Skin:     General: Skin is warm and dry.  Neurological:  General: No focal deficit present.     Mental Status: She is alert and oriented to person, place, and time.  Psychiatric:        Mood and Affect: Mood normal.        Behavior: Behavior normal.      UC Treatments / Results  Labs (all labs ordered are listed, but only abnormal results are displayed) Labs Reviewed - No data to display Comprehensive Metabolic Panel Order: [id number] Component Ref Range & Units 7 mo ago  Sodium 136 - 145 mmol/L 138  Potassium 3.5 - 5.1 mmol/L 4.7  Comment: NO VISIBLE HEMOLYSIS  Chloride 98 - 107 mmol/L 104  CO2 21 - 31 mmol/L 25  Anion Gap 6 - 14 mmol/L 9  Glucose, Random 70 - 99 mg/dL 81  Blood Urea Nitrogen (BUN) 7 - 25 mg/dL 15  Creatinine 8.11 - 9.14 mg/dL 0.8  eGFR >78 GN/FAO/1.30Q6 >90  Comment: GFR estimated by CKD-EPI equations(NKF 2021).  "Recommend confirmation of Cr-based eGFR by using Cys-based eGFR and other filtration markers (if applicable) in complex cases and clinical decision-making, as needed."  Albumin 3.5 - 5.7 g/dL 4  Total Protein 6.4 - 8.9 g/dL 7.1  Bilirubin, Total 0.3 - 1.0 mg/dL 0.3  Alkaline Phosphatase (ALP) 34 - 104 U/L 102  Aspartate Aminotransferase (AST) 13 - 39 U/L 27  Alanine Aminotransferase (ALT) 7 - 52 U/L 53 High   Calcium 8.6 - 10.3 mg/dL 9.9  BUN/Creatinine Ratio   Comment: Creatinine is normal, ratio is not clinically indicated.  Resulting Agency [facility](CLIA# [id number])   Specimen Collected: 06/16/23 15:02   Performed by: Rosalva Comber [facility](CLIA# [id number]) Last Resulted: 06/16/23 17:15  Received From: [facility]  Result Received: 12/24/23 13:51   EKG   Radiology No results found.  Procedures Procedures (including critical care time)  Medications Ordered in UC Medications  acetaminophen (TYLENOL) tablet 975 mg (975 mg Oral Given 01/28/24 1054)    Initial Impression /  Assessment and Plan / UC Course  I have reviewed the triage vital signs and the nursing notes.  Pertinent labs & imaging results that were available during my care of the patient were reviewed by me and considered in my medical decision making (see chart for details).     Reviewed exam and symptoms with patient.  She was given Tylenol in clinic for fever/pain.  Will start antibiotic eardrops as well as Augmentin .  Tramadol as needed for pain.  No signs of mastoiditis on exam.  Advised PCP follow-up 2 days for recheck.  ER precautions reviewed and patient verbalized understanding. Final Clinical Impressions(s) / UC Diagnoses   Final diagnoses:  Acute otitis externa of right ear, unspecified type     Discharge Instructions      Start antibiotic eardrops and oral antibiotic as prescribed.  You were given Tylenol in the clinic and you may continue over-the-counter Tylenol and/or ibuprofen  as needed for pain/fever.  You may take tramadol every 6 hours as needed for your pain.  Please follow-up with your PCP in 2 days for recheck.  Please go to the ER if you develop any worsening symptoms including but not limited to persistent uncontrolled pain or fever after 24 hours of treatment, facial swelling or neck swelling, or any new concerns that arise.  Hope you feel better soon!   ED Prescriptions     Medication Sig Dispense Auth. Provider   ofloxacin (FLOXIN) 0.3 % OTIC solution Place  10 drops into the right ear daily for 7 days. 5 mL Vihan Santagata, Jodi R, NP   amoxicillin -clavulanate (AUGMENTIN ) 875-125 MG tablet Take 1 tablet by mouth every 12 (twelve) hours for 10 days. 20 tablet Lindora Alviar, Jodi R, NP   traMADol (ULTRAM) 50 MG tablet Take 1 tablet (50 mg total) by mouth every 6 (six) hours as needed for severe pain (pain score 7-10). 10 tablet Kayo Zion, Jodi R, NP      I have reviewed the PDMP during this encounter.   Alleen Arbour, NP 01/28/24 1059    Alleen Arbour, NP 01/28/24 1100

## 2024-01-30 ENCOUNTER — Ambulatory Visit
Admission: EM | Admit: 2024-01-30 | Discharge: 2024-01-30 | Disposition: A | Discharge status: Home / Self Care | Attending: Family Medicine | Admitting: Family Medicine

## 2024-01-30 DIAGNOSIS — H60391 Other infective otitis externa, right ear: Secondary | ICD-10-CM | POA: Diagnosis not present

## 2024-01-30 DIAGNOSIS — H65191 Other acute nonsuppurative otitis media, right ear: Secondary | ICD-10-CM

## 2024-01-30 MED ORDER — CELECOXIB 200 MG PO CAPS: 200.0000 mg | ORAL_CAPSULE | Freq: Two times a day (BID) | ORAL | 0 refills | Status: AC

## 2024-01-30 MED ORDER — CIPROFLOXACIN-DEXAMETHASONE 0.3-0.1 % OT SUSP: 4.0000 [drp] | Freq: Two times a day (BID) | OTIC | 0 refills | Status: AC

## 2024-01-30 NOTE — ED Triage Notes (Signed)
 Pt c/o right earache and right facial swelling-states she was seen for same 2 days ago-feels no better after meds-NAD-steady gait

## 2024-01-30 NOTE — ED Provider Notes (Signed)
 [facility]  Note:  This document was prepared using Conservation officer, historic buildings and may include unintentional dictation errors.  MRN: [id number] DOB: [birth year]  Subjective:   Melissa Ramos is a 28 y.o. female presenting for recheck on persistent and worsening right ear pain, right ear swelling now worsening with radiation of her pain to the right jaw, neck and behind her ear.  Patient has been using the medications prescribed but feels like she cannot get the medication into the ear canal.  Has been using 800 mg ibuprofen  every 3 hours.  No hematemesis, bloody stools, significant abdominal pain.  Has been supplementing this with Prilosec.  No current facility-administered medications for this encounter.  Current Outpatient Medications:    albuterol  (PROAIR  HFA) 108 (90 Base) MCG/ACT inhaler, Inhale 2 puffs into the lungs every 4 (four) hours as needed for wheezing or shortness of breath., Disp: 1 Inhaler, Rfl: 1   amoxicillin -clavulanate (AUGMENTIN ) 875-125 MG tablet, Take 1 tablet by mouth every 12 (twelve) hours for 10 days., Disp: 20 tablet, Rfl: 0   azelastine  (ASTELIN ) 0.1 % nasal spray, 1-2 sprays per nostril 2 times daily as needed for runny nose (Patient taking differently: Place 1 spray into both nostrils 2 (two) times daily as needed.), Disp: 30 mL, Rfl: 5   Carbinoxamine  Maleate ER (KARBINAL  ER) 4 MG/5ML SUER, Take 4 mg by mouth as needed (take 7.5 ml -20ml twice daily if needed for runny nose). (Patient taking differently: Take 6-16 mg by mouth 2 (two) times daily as needed (for runny nose).), Disp: 1200 mL, Rfl: 5   EPINEPHrine  0.3 mg/0.3 mL IJ SOAJ injection, Use as directed for severe allergic reaction. (Patient taking differently: Inject 0.3 mg into the muscle as needed (for severe allergic reaction.).), Disp: 2 Device, Rfl: 1   escitalopram (LEXAPRO) 20 MG tablet, Take 1 tablet by mouth daily., Disp: , Rfl:    fluticasone  (FLONASE ) 50 MCG/ACT  nasal spray, Place 2 sprays into both nostrils daily as needed for allergies., Disp: 16 g, Rfl: 0   norethindrone (MICRONOR) 0.35 MG tablet, Take 1 tablet by mouth daily., Disp: , Rfl:    ofloxacin (FLOXIN) 0.3 % OTIC solution, Place 10 drops into the right ear daily for 7 days., Disp: 5 mL, Rfl: 0   predniSONE  (DELTASONE ) 20 MG tablet, Take 2 tablets daily with breakfast., Disp: 10 tablet, Rfl: 0   promethazine -dextromethorphan (PROMETHAZINE -DM) 6.25-15 MG/5ML syrup, Take 5 mLs by mouth 3 (three) times daily as needed for cough., Disp: 200 mL, Rfl: 0   propranolol ER (INDERAL LA) 60 MG 24 hr capsule, Take 60 mg by mouth daily., Disp: , Rfl:    traMADol (ULTRAM) 50 MG tablet, Take 1 tablet (50 mg total) by mouth every 6 (six) hours as needed for severe pain (pain score 7-10)., Disp: 10 tablet, Rfl: 0   Allergies  Allergen Reactions   Silver Itching    Bad Blisters   Tape Dermatitis    Other Reaction(s): Dermatitis, Dermatitis    Oozing Blisters and Welts  Oozing Blisters and Welts    Past Medical History:  Diagnosis Date   Asthma      Past Surgical History:  Procedure Laterality Date   BUNIONECTOMY     HAND LIGAMENT RECONSTRUCTION     TONSILLECTOMY     WISDOM TOOTH EXTRACTION  2017    Family History  Problem Relation Age of Onset   Allergic rhinitis Mother    Asthma Mother    Food  Allergy Mother    Allergic rhinitis Brother    Asthma Brother    Angioedema Neg Hx    Eczema Neg Hx    Immunodeficiency Neg Hx    Urticaria Neg Hx     Social History   Tobacco Use   Smoking status: Never   Smokeless tobacco: Never  Vaping Use   Vaping status: Every Day  Substance Use Topics   Alcohol use: No   Drug use: No    ROS   Objective:   Vitals: BP 132/79 (BP Location: Left Arm)   Pulse 92   Temp 98.7 F (37.1 C) (Oral)   Resp 20   LMP 01/25/2024 (Exact Date)   SpO2 98%   Physical Exam Constitutional:      General: She is not in acute distress.    Appearance:  Normal appearance. She is well-developed. She is not ill-appearing, toxic-appearing or diaphoretic.  HENT:     Head: Normocephalic and atraumatic.     Right Ear: Tenderness present.     Left Ear: Tympanic membrane, ear canal and external ear normal. No tenderness. There is no impacted cerumen. Tympanic membrane is not injected, perforated, erythematous or bulging.     Ears:     Comments: Significant swelling of the right ear canal with drainage.  Ear wick placed using alligator forceps.    Nose: Nose normal.     Mouth/Throat:     Mouth: Mucous membranes are moist.  Eyes:     General: No scleral icterus.       Right eye: No discharge.        Left eye: No discharge.     Extraocular Movements: Extraocular movements intact.  Cardiovascular:     Rate and Rhythm: Normal rate.  Pulmonary:     Effort: Pulmonary effort is normal.  Skin:    General: Skin is warm and dry.  Neurological:     General: No focal deficit present.     Mental Status: She is alert and oriented to person, place, and time.  Psychiatric:        Mood and Affect: Mood normal.        Behavior: Behavior normal.     Assessment and Plan :   PDMP not reviewed this encounter.  1. Infective otitis externa of right ear   2. Other acute nonsuppurative otitis media of right ear, recurrence not specified    Switch to Ciprodex otic.  Ear wick placed as above.  Stop ibuprofen  and switch to Celebrex.  Will avoid prednisone  as an effort to prevent peptic ulcer disease, GI bleeding.  Maintain Augmentin  and Tylenol.  Counseled patient on potential for adverse effects with medications prescribed/recommended today, ER and return-to-clinic precautions discussed, patient verbalized understanding.    Adolph Hoop, [place] 01/30/24 1539

## 2024-01-30 NOTE — Discharge Instructions (Addendum)
 Please switch to Ciprodex eardrops instead of ofloxacin otic.  Leave the ear wick in place as long as possible.  Continue taking the Augmentin . Stop ibuprofen  and switch to celecoxib.

## 2024-02-03 ENCOUNTER — Emergency Department (HOSPITAL_BASED_OUTPATIENT_CLINIC_OR_DEPARTMENT_OTHER)

## 2024-02-03 ENCOUNTER — Emergency Department (HOSPITAL_BASED_OUTPATIENT_CLINIC_OR_DEPARTMENT_OTHER)
Admission: EM | Admit: 2024-02-03 | Discharge: 2024-02-03 | Disposition: A | Discharge status: Home / Self Care | Attending: Emergency Medicine | Admitting: Emergency Medicine

## 2024-02-03 ENCOUNTER — Other Ambulatory Visit: Payer: Self-pay

## 2024-02-03 DIAGNOSIS — H60501 Unspecified acute noninfective otitis externa, right ear: Secondary | ICD-10-CM | POA: Diagnosis not present

## 2024-02-03 DIAGNOSIS — H6691 Otitis media, unspecified, right ear: Secondary | ICD-10-CM | POA: Diagnosis not present

## 2024-02-03 DIAGNOSIS — J45909 Unspecified asthma, uncomplicated: Secondary | ICD-10-CM | POA: Insufficient documentation

## 2024-02-03 DIAGNOSIS — Z7952 Long term (current) use of systemic steroids: Secondary | ICD-10-CM | POA: Insufficient documentation

## 2024-02-03 DIAGNOSIS — H9201 Otalgia, right ear: Secondary | ICD-10-CM | POA: Diagnosis present

## 2024-02-03 DIAGNOSIS — Z7951 Long term (current) use of inhaled steroids: Secondary | ICD-10-CM | POA: Insufficient documentation

## 2024-02-03 DIAGNOSIS — H6690 Otitis media, unspecified, unspecified ear: Secondary | ICD-10-CM

## 2024-02-03 LAB — PREGNANCY, URINE: Preg Test, Ur: NEGATIVE

## 2024-02-03 LAB — BASIC METABOLIC PANEL WITH GFR
Anion gap: 11 (ref 5–15)
BUN: 16 mg/dL (ref 6–20)
CO2: 22 mmol/L (ref 22–32)
Calcium: 9.2 mg/dL (ref 8.9–10.3)
Chloride: 104 mmol/L (ref 98–111)
Creatinine, Ser: 0.76 mg/dL (ref 0.44–1.00)
GFR, Estimated: >60 mL/min (ref >60)
Glucose, Bld: 91 mg/dL (ref 70–99)
Potassium: 4.2 mmol/L (ref 3.5–5.1)
Sodium: 138 mmol/L (ref 135–145)

## 2024-02-03 LAB — CBC
HCT: 38.3 % (ref 36.0–46.0)
Hemoglobin: 12.8 g/dL (ref 12.0–15.0)
MCH: 28.1 pg (ref 26.0–34.0)
MCHC: 33.4 g/dL (ref 30.0–36.0)
MCV: 84 fL (ref 80.0–100.0)
Platelets: 351 10*3/uL (ref 150–400)
RBC: 4.56 MIL/uL (ref 3.87–5.11)
RDW: 11.8 % (ref 11.5–15.5)
WBC: 8.6 10*3/uL (ref 4.0–10.5)
nRBC: 0 % (ref 0.0–0.2)

## 2024-02-03 MED ORDER — OXYCODONE-ACETAMINOPHEN 5-325 MG PO TABS: 1.0000 | ORAL_TABLET | Freq: Four times a day (QID) | ORAL | 0 refills | Status: AC | PRN

## 2024-02-03 MED ORDER — KETOROLAC TROMETHAMINE 15 MG/ML IJ SOLN
15.0000 mg | Freq: Once | INTRAMUSCULAR | Status: AC
  Administered 2024-02-03: 15 mg via INTRAVENOUS
  Filled 2024-02-03: qty 1

## 2024-02-03 MED ORDER — CIPROFLOXACIN HCL 500 MG PO TABS: 500.0000 mg | ORAL_TABLET | Freq: Two times a day (BID) | ORAL | 0 refills | Status: AC

## 2024-02-03 MED ORDER — IOHEXOL 300 MG/ML  SOLN
75.0000 mL | Freq: Once | INTRAMUSCULAR | Status: AC | PRN
  Administered 2024-02-03: 75 mL via INTRAVENOUS

## 2024-02-03 MED ORDER — CIPROFLOXACIN HCL 500 MG PO TABS
500.0000 mg | ORAL_TABLET | Freq: Once | ORAL | Status: AC
  Administered 2024-02-03: 500 mg via ORAL
  Filled 2024-02-03: qty 1

## 2024-02-03 NOTE — ED Triage Notes (Signed)
 Ear infection since Sunday that has become worse and increased pain/swelling. Pt has been on oral abx and ear drop abx x 6 days

## 2024-02-03 NOTE — Discharge Instructions (Addendum)
 Continue your Augmentin  until completed. Start Cipro  and take as directed. You can take Percocet for pain as needed. Ibuprofen  for any fever. Continue using the Cirpodex drops.   Call Dr. Basilio Both office and schedule a recheck appointment for later this week. If you have any uncontrolled symptoms of pain or fever, return here as needed.

## 2024-02-03 NOTE — ED Provider Notes (Signed)
  Physical Exam  BP 112/64   Pulse 78   Temp 98.3 F (36.8 C) (Oral)   Resp 19   LMP 01/25/2024 (Exact Date)   SpO2 100%   Physical Exam  Procedures  Procedures  ED Course / MDM    Medical Decision Making Amount and/or Complexity of Data Reviewed Labs: ordered. Radiology: ordered.  Risk Prescription drug management.   Patient with right ear pain, right facial swelling. Has been seen multiple times for right ear infection. First on Ofloxacin  and Augmentin . Second time on Ciprodex . Continues to have significant swelling and purulence in external canal. Visualized TM clear. Today has significant mastoid tenderness. CT for malignant otitis externa vs masoiditis pending. If negative, can change augmentin  to oral Cipro  and refer to ENT.   CT temporal bone per radiologist interpretation:   IMPRESSION: Right mastoid effusion concerning for mastoiditis. No imaging findings to suggest coalescent mastoiditis.   No inflammatory changes or abscess adjacent to the right mastoid temporal bone.   Soft tissue within the right middle ear cavity concerning for otitis media. Underlying small mass is difficult to exclude.   Medial retraction of the right tympanic membrane as well as soft tissue along the anterior ligament of the malleus suggestive of chronic infection/inflammation.   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 Thinning of the sigmoid plate without evidence of dehiscence.   Abnormal soft tissue along the right external auditory canal concerning for otitis externa.   Unremarkable CT of the left temporal bone.  ENT consultation: Dr. Milon Aloe  He reviewed the images. He does not feel mastoiditis is present. Recommends completing Augmentin  Rx, start Cipro , culture the external canal, continue Ciprodex  and follow up in office later this week.   Patient updated on all the above. Culture obtained. Patient is stable for discharge home.         Mandy Second, PA-C 02/03/24 2221    Nicklas Barns, MD 02/07/24 [phone]

## 2024-02-03 NOTE — ED Provider Notes (Signed)
 [place] [facility] Provider Note   CSN: [id number] Arrival date & time: 02/03/24  1732     History {Add pertinent medical, surgical, social history, OB history to HPI:1} Chief Complaint  Patient presents with   Facial Swelling    Melissa Ramos is a 28 y.o. female.  HPI   28 year old female presents emergency department with complaints of right-sided ear pain.  Has been having symptoms for the past 9 to 10 days.  Was seen at urgent care on 01/28/2024 and diagnosed with otitis externa.  Was placed on ofloxacin  at that time as well as given Augmentin  given nonvisualization of the right sided TM.  Patient states that she use medicines as directed with continued pain prompting visit again to the urgent care on 01/30/2024.  At that time, patient was placed on Ciprodex  drops, NSAIDs and had an ear wick placed.  Patient states that she took these medications as directed but still with pain in the right ear.  Reports having pus drainage as well as some blood noticed.  Reports decreased hearing out of the right side.  Has also noticed pain radiating down the right side of her neck and right side of her face.  States that her right face did feel swollen a couple days ago but has since improved.  Denies any dental pain.  States she did have a fever when her symptoms began but has been afebrile for the past few days.  Denies any cough, nasal congestion, sore throat.  Presents emergency department due to continued right ear symptoms.  Denies any history of chronic ear issues.  Does state that she had ear infections when she was a child but has not had any complications since then.  Has not followed with an ENT specialist regarding her current symptoms.  Past medical history significant for asthma, seasonal allergies  Home Medications Prior to Admission medications   Medication Sig Start Date End Date Taking? Authorizing Provider  albuterol  (PROAIR  HFA) 108 (90 Base)  MCG/ACT inhaler Inhale 2 puffs into the lungs every 4 (four) hours as needed for wheezing or shortness of breath. 10/19/17   Bobbitt, Colen Daunt, MD  amoxicillin -clavulanate (AUGMENTIN ) 875-125 MG tablet Take 1 tablet by mouth every 12 (twelve) hours for 10 days. 01/28/24 02/07/24  Mayer, Jodi R, NP  azelastine  (ASTELIN ) 0.1 % nasal spray 1-2 sprays per nostril 2 times daily as needed for runny nose Patient taking differently: Place 1 spray into both nostrils 2 (two) times daily as needed. 10/19/17   Bobbitt, Colen Daunt, MD  Carbinoxamine  Maleate ER (KARBINAL  ER) 4 MG/5ML SUER Take 4 mg by mouth as needed (take 7.5 ml -20ml twice daily if needed for runny nose). Patient taking differently: Take 6-16 mg by mouth 2 (two) times daily as needed (for runny nose). 10/19/17   Bobbitt, Colen Daunt, MD  celecoxib  (CELEBREX ) 200 MG capsule Take 1 capsule (200 mg total) by mouth 2 (two) times daily. 01/30/24   Adolph Hoop, PA-C  ciprofloxacin -dexamethasone  (CIPRODEX ) OTIC suspension Place 4 drops into the right ear 2 (two) times daily. 01/30/24   Adolph Hoop, PA-C  EPINEPHrine  0.3 mg/0.3 mL IJ SOAJ injection Use as directed for severe allergic reaction. Patient taking differently: Inject 0.3 mg into the muscle as needed (for severe allergic reaction.). 10/19/17   Bobbitt, Colen Daunt, MD  escitalopram (LEXAPRO) 20 MG tablet Take 1 tablet by mouth daily. 12/11/23 12/05/24  [provider]  fluticasone  (FLONASE ) 50 MCG/ACT nasal spray Place  2 sprays into both nostrils daily as needed for allergies. 09/25/18   Bardelas, Jose A, MD  norethindrone (MICRONOR) 0.35 MG tablet Take 1 tablet by mouth daily.    [provider]  predniSONE  (DELTASONE ) 20 MG tablet Take 2 tablets daily with breakfast. 12/24/23   Adolph Hoop, PA-C  promethazine -dextromethorphan (PROMETHAZINE -DM) 6.25-15 MG/5ML syrup Take 5 mLs by mouth 3 (three) times daily as needed for cough. 12/24/23   Adolph Hoop, PA-C  propranolol ER (INDERAL LA)  60 MG 24 hr capsule Take 60 mg by mouth daily.    [provider]  traMADol  (ULTRAM ) 50 MG tablet Take 1 tablet (50 mg total) by mouth every 6 (six) hours as needed for severe pain (pain score 7-10). 01/28/24   Mayer, Jodi R, NP      Allergies    Silver and Tape    Review of Systems   Review of Systems  All other systems reviewed and are negative.   Physical Exam Updated Vital Signs BP 112/64   Pulse 78   Temp 98.3 F (36.8 C) (Oral)   Resp 19   LMP 01/25/2024 (Exact Date)   SpO2 100%  Physical Exam Vitals and nursing note reviewed.  Constitutional:      General: She is not in acute distress.    Appearance: She is well-developed.  HENT:     Head: Normocephalic and atraumatic.     Right Ear: Decreased hearing noted. Swelling and tenderness present. There is mastoid tenderness.     Left Ear: Tympanic membrane, ear canal and external ear normal.     Ears:     Comments: Patient with swelling right-sided external auditory canal with maceration present.  Only able to visualize upper third of right-sided TM which appears to be intact, not erythematous without evidence of middle ear effusion.  External ear unremarkable.  Patient's right-sided mastoid was tender to the touch but without overlying erythema, palpable fluctuance or induration.   Eyes:     Conjunctiva/sclera: Conjunctivae normal.  Cardiovascular:     Rate and Rhythm: Normal rate and regular rhythm.     Heart sounds: No murmur heard. Pulmonary:     Effort: Pulmonary effort is normal. No respiratory distress.     Breath sounds: Normal breath sounds.  Abdominal:     Palpations: Abdomen is soft.     Tenderness: There is no abdominal tenderness.  Musculoskeletal:        General: No swelling.     Cervical back: Neck supple.  Skin:    General: Skin is warm and dry.     Capillary Refill: Capillary refill takes less than 2 seconds.  Neurological:     Mental Status: She is alert.  Psychiatric:        Mood and  Affect: Mood normal.     ED Results / Procedures / Treatments   Labs (all labs ordered are listed, but only abnormal results are displayed) Labs Reviewed  BASIC METABOLIC PANEL WITH GFR  CBC  PREGNANCY, URINE    EKG None  Radiology No results found.  Procedures Procedures  {Document cardiac monitor, telemetry assessment procedure when appropriate:1}  Medications Ordered in ED Medications  ketorolac (TORADOL) 15 MG/ML injection 15 mg (15 mg Intravenous Given 02/03/24 1819)    ED Course/ Medical Decision Making/ A&P   {   Click here for ABCD2, HEART and other calculatorsREFRESH Note before signing :1}  Medical Decision Making Amount and/or Complexity of Data Reviewed Labs: ordered. Radiology: ordered.  Risk Prescription drug management.   This patient presents to the ED for concern of ear pain, this involves an extensive number of treatment options, and is a complaint that carries with it a high risk of complications and morbidity.  The differential diagnosis includes otitis externa, otitis media, malignant otitis externa, otitis media, mastoiditis, abscess, other   Co morbidities that complicate the patient evaluation  See HPI   Additional history obtained:  Additional history obtained from EMR External records from outside source obtained and reviewed including hospital records   Lab Tests:  Lab tests pending   Imaging Studies ordered:  I ordered imaging studies including CT temporal bones which is pending upon shift change.   Cardiac Monitoring: / EKG:  N/a   Consultations Obtained:  N/a   Problem List / ED Course / Critical interventions / Medication management  Right ear pain I ordered medication including toradol   Reevaluation of the patient after these medicines showed that the patient improved I have reviewed the patients home medicines and have made adjustments as needed   Social Determinants of  Health:  Denies tobacco, licit drug use   Test / Admission - Considered:  Right ear pain Vitals signs within normal range and stable throughout visit. Laboratory/imaging studies significant for: See above 28 year old female presents emergency department with complaints of right-sided ear pain.  Has been having symptoms for the past 9 to 10 days.  Was seen at urgent care on 01/28/2024 and diagnosed with otitis externa.  Was placed on ofloxacin  at that time as well as given Augmentin  given nonvisualization of the right sided TM.  Patient states that she use medicines as directed with continued pain prompting visit again to the urgent care on 01/30/2024.  At that time, patient was placed on Ciprodex  drops, NSAIDs and had an ear wick placed.  Patient states that she took these medications as directed but still with pain in the right ear.  Reports having pus drainage as well as some blood noticed.  Reports decreased hearing out of the right side.  Has also noticed pain radiating down the right side of her neck and right side of her face.  States that her right face did feel swollen a couple days ago but has since improved.  Denies any dental pain.  States she did have a fever when her symptoms began but has been afebrile for the past few days.  Denies any cough, nasal congestion, sore throat.  Presents emergency department due to continued right ear symptoms.  Denies any history of chronic ear issues.  Does state that she had ear infections when she was a child but has not had any complications since then.  Has not followed with an ENT specialist regarding her current symptoms. On exam, partial visualization of right sided TM unremarkable for any acute process.  External auditory canal on the right side with appreciable swelling/narrowing when compared contralaterally with maceration present and some discharge present.  Patient did have some tenderness of right-sided mastoid although no overlying erythema or  swelling present tender.  Patient has been on 2 different topical antibiotics for otitis externa over the past 9 to 10 days without any improvement of her symptoms as well as oral Augmentin .  At time of shift change, awaiting labs as well as CT imaging of patient's temporal bones for assessment of possible mastoiditis versus malignant otitis externa versus other.  Disposition pending  at this time. At shift change, patient care handed off to PA-C [place].  Patient stable upon shift change.   {Document critical care time when appropriate:1} {Document review of labs and clinical decision tools ie heart score, Chads2Vasc2 etc:1}  {Document your independent review of radiology images, and any outside records:1} {Document your discussion with family members, caretakers, and with consultants:1} {Document social determinants of health affecting pt's care:1} {Document your decision making why or why not admission, treatments were needed:1} Final Clinical Impression(s) / ED Diagnoses Final diagnoses:  None    Rx / DC Orders ED Discharge Orders     None

## 2024-02-05 ENCOUNTER — Telehealth (INDEPENDENT_AMBULATORY_CARE_PROVIDER_SITE_OTHER): Payer: Self-pay | Admitting: Otolaryngology

## 2024-02-05 NOTE — Telephone Encounter (Signed)
 Patient called and stated that she finishes her antibiotics this week and wanted to know if there are any recommendations to do before she see you next week?

## 2024-02-07 LAB — EAR CULTURE
Culture: NORMAL
Special Requests: NORMAL

## 2024-02-08 ENCOUNTER — Telehealth (HOSPITAL_BASED_OUTPATIENT_CLINIC_OR_DEPARTMENT_OTHER): Payer: Self-pay | Admitting: *Deleted

## 2024-02-08 NOTE — Telephone Encounter (Signed)
 Post ED Visit - Positive Culture Follow-up  Culture report reviewed by antimicrobial stewardship pharmacist: [facility] [x]  Barbra Boone, [place].D. []  Skeet Duke, Pharm.D., BCPS AQ-ID []  Leslee Rase, Pharm.D., BCPS []  Garland Junk, Pharm.D., BCPS []  [place], [place].D., BCPS, AAHIVP []  Alcide Aly, Pharm.D., BCPS, AAHIVP []  Jerri Morale, PharmD, BCPS []  Graham Laws, PharmD, BCPS []  Cleda Curly, PharmD, BCPS []  Tamar Fairly, PharmD []  Ballard Levels, PharmD, BCPS []  Ollen Beverage, PharmD  [facility] []  Arlyne Bering, PharmD []  Sherryle Don, PharmD []  Van Gelinas, PharmD []  Delila Felty, Rph []  [place]) Cleora Daft, PharmD []  Augustina Block, PharmD []  Arie Kurtz, PharmD []  Sharlyn Deaner, PharmD []  Agnes Hose, PharmD []  Kendall Pauls, PharmD []  Gladstone Lamer, PharmD []  Armanda Bern, PharmD []  Tera Fellows, PharmD   Positive ear culture Pt was switched from augmentin  to ciprofloxacin  and continued on ciprodex .  Likely contaminant; pt has appropriate follow-up.  No further patient follow-up is required at this time.  Zeb Heys 02/08/2024, 10:46 AM

## 2024-02-15 ENCOUNTER — Institutional Professional Consult (permissible substitution) (INDEPENDENT_AMBULATORY_CARE_PROVIDER_SITE_OTHER): Admitting: Otolaryngology
